# Patient Record
Sex: Male | Born: 1995 | Race: Black or African American | Hispanic: No | Smoking: Never smoker
Health system: Southern US, Community
[De-identification: ages and names within clinical notes are randomized; demographics above are authoritative.]

---
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
## 2013-03-19 ENCOUNTER — Encounter (HOSPITAL_COMMUNITY): Payer: Self-pay | Admitting: Emergency Medicine

## 2013-03-19 ENCOUNTER — Emergency Department (INDEPENDENT_AMBULATORY_CARE_PROVIDER_SITE_OTHER)
Admission: EM | Admit: 2013-03-19 | Discharge: 2013-03-19 | Disposition: A | Discharge status: Home / Self Care | Payer: Self-pay | Source: Home / Self Care | Attending: Emergency Medicine | Admitting: Emergency Medicine

## 2013-03-19 DIAGNOSIS — J069 Acute upper respiratory infection, unspecified: Secondary | ICD-10-CM

## 2013-03-19 MED ORDER — IPRATROPIUM BROMIDE 0.06 % NA SOLN: 2.0000 | Freq: Four times a day (QID) | NASAL | Status: AC

## 2013-03-19 NOTE — ED Notes (Signed)
Pt  Reports  Symptoms   Nasal  Congestion    /   Cough   /  Congested      sorethroat  With  The  Symptoms  Starting         sev days  Ago      Pt is  Sitting  Upright on exam table  Speaking  In  Complete  sentances  In no  Severe  Distress

## 2013-03-19 NOTE — ED Provider Notes (Signed)
Medical screening examination/treatment/procedure(s) were performed by non-physician practitioner and as supervising physician I was immediately available for consultation/collaboration.  Jessyca Sloan, M.D.  Vinny Taranto C Kylar Speelman, MD 03/19/13 2139 

## 2013-03-19 NOTE — ED Provider Notes (Signed)
CSN: [id number]     Arrival date & time 03/19/13  1029 History   First MD Initiated Contact with Patient 03/19/13 1330     Chief Complaint  Patient presents with  . URI   (Consider location/radiation/quality/duration/timing/severity/associated sxs/prior Treatment) Patient is a 18 y.o. male presenting with URI. The history is provided by the patient.  URI Presenting symptoms: congestion, cough, rhinorrhea and sore throat   Presenting symptoms: no fever   Severity:  Mild Onset quality:  Gradual Duration:  2 days Progression:  Waxing and waning Chronicity:  New Associated symptoms: headaches   Associated symptoms: no arthralgias, no myalgias, no neck pain, no sinus pain, no sneezing, no swollen glands and no wheezing     History reviewed. No pertinent past medical history. History reviewed. No pertinent past surgical history. History reviewed. No pertinent family history. History  Substance Use Topics  . Smoking status: Not on file  . Smokeless tobacco: Not on file  . Alcohol Use: Not on file    Review of Systems  Constitutional: Negative for fever.  HENT: Positive for congestion, rhinorrhea and sore throat. Negative for sneezing.   Eyes: Negative.   Respiratory: Positive for cough. Negative for wheezing.   Cardiovascular: Negative.   Gastrointestinal: Negative.   Musculoskeletal: Negative for arthralgias, myalgias and neck pain.  Skin: Negative.   Neurological: Positive for headaches.    Allergies  Review of patient's allergies indicates no known allergies.  Home Medications  No current outpatient prescriptions on file. BP 122/66  Pulse 64  Temp(Src) 98.5 F (36.9 C) (Oral)  Resp 14  SpO2 98% Physical Exam  Nursing note and vitals reviewed. Constitutional: He is oriented to person, place, and time. He appears well-developed and well-nourished. No distress.  HENT:  Head: Normocephalic and atraumatic.  Right Ear: Hearing, tympanic membrane, external ear and ear  canal normal.  Left Ear: Hearing, tympanic membrane, external ear and ear canal normal.  Nose: Nose normal.  Mouth/Throat: Uvula is midline, oropharynx is clear and moist and mucous membranes are normal.  Eyes: Conjunctivae are normal. Right eye exhibits no discharge. Left eye exhibits no discharge. No scleral icterus.  Neck: Normal range of motion. Neck supple.  Cardiovascular: Normal rate, regular rhythm and normal heart sounds.   Pulmonary/Chest: Effort normal and breath sounds normal.  Lymphadenopathy:    He has no cervical adenopathy.  Neurological: He is alert and oriented to person, place, and time.  Skin: Skin is warm and dry. No rash noted.  Psychiatric: He has a normal mood and affect. His behavior is normal.    ED Course  Procedures (including critical care time) Labs Review Labs Reviewed - No data to display Imaging Review No results found.  EKG Interpretation    Date/Time:    Ventricular Rate:    PR Interval:    QRS Duration:   QT Interval:    QTC Calculation:   R Axis:     Text Interpretation:              MDM  Exam consistent with mild URI. States he is most bothered by his runny nose and nasal congestion. Will provide Rx for atrovent nasal spray and patient counseled about additional symptomatic care at home.     Jess BartersJennifer Lee OconeePresson, [place] 03/19/13 1349

## 2019-01-31 ENCOUNTER — Other Ambulatory Visit: Payer: Self-pay

## 2019-01-31 ENCOUNTER — Encounter (HOSPITAL_COMMUNITY): Payer: Self-pay | Admitting: Emergency Medicine

## 2019-01-31 ENCOUNTER — Emergency Department (HOSPITAL_COMMUNITY)
Admission: EM | Admit: 2019-01-31 | Discharge: 2019-01-31 | Disposition: A | Discharge status: Home / Self Care | Payer: Self-pay | Attending: Emergency Medicine | Admitting: Emergency Medicine

## 2019-01-31 DIAGNOSIS — R0981 Nasal congestion: Secondary | ICD-10-CM | POA: Insufficient documentation

## 2019-01-31 DIAGNOSIS — J029 Acute pharyngitis, unspecified: Secondary | ICD-10-CM | POA: Insufficient documentation

## 2019-01-31 DIAGNOSIS — Z20828 Contact with and (suspected) exposure to other viral communicable diseases: Secondary | ICD-10-CM | POA: Insufficient documentation

## 2019-01-31 DIAGNOSIS — R0602 Shortness of breath: Secondary | ICD-10-CM | POA: Insufficient documentation

## 2019-01-31 LAB — GROUP A STREP BY PCR: Group A Strep by PCR: NOT DETECTED

## 2019-01-31 LAB — SARS CORONAVIRUS 2 (TAT 6-24 HRS): SARS Coronavirus 2: NEGATIVE

## 2019-01-31 NOTE — ED Provider Notes (Signed)
[facility] EMERGENCY DEPARTMENT Provider Note   CSN: [id number] Arrival date & time: 01/31/19  1611     History   Chief Complaint Chief Complaint  Patient presents with  . Sore Throat    HPI Hunter Harrison is a 23 y.o. male.     Patient is a 60 year old gentleman with no past medical history presenting to the emergency department for nasal congestion and sore throat which began yesterday. He reports that he is still eating and drinking normally and has not had a fever. Denies any shortness of breath but reports that it is hard to breathe out of his nostrils due to the amount of congestion. He has not taken any medication for relief. 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 Otherwise has no known exposure.     History reviewed. No pertinent past medical history.  There are no active problems to display for this patient.   History reviewed. No pertinent surgical history.      Home Medications    Prior to Admission medications   Not on File    Family History History reviewed. No pertinent family history.  Social History Social History   Tobacco Use  . Smoking status: Never Smoker  . Smokeless tobacco: Never Used  Substance Use Topics  . Alcohol use: Not Currently  . Drug use: Not Currently     Allergies   Patient has no known allergies.   Review of Systems Review of Systems  Constitutional: Negative for appetite change, chills, diaphoresis, fatigue and fever.  HENT: Positive for congestion, rhinorrhea and sore throat. Negative for ear pain, mouth sores, sinus pressure, sinus pain, sneezing, tinnitus, trouble swallowing and voice change.   Eyes: Negative for redness.  Respiratory: Positive for cough. Negative for apnea, choking, chest tightness, shortness of breath, wheezing and stridor.   Cardiovascular: Negative for chest pain.   Gastrointestinal: Negative for abdominal pain, diarrhea, nausea and vomiting.  Genitourinary: Negative for dysuria.  Musculoskeletal: Negative for back pain.  Skin: Negative for rash and wound.  Neurological: Negative for dizziness, light-headedness and headaches.     Physical Exam Updated Vital Signs BP 133/76 (BP Location: Right Arm)   Pulse 68   Temp 98.9 F (37.2 C) (Oral)   Resp 15   SpO2 100%   Physical Exam Vitals signs and nursing note reviewed.  Constitutional:      General: He is not in acute distress.    Appearance: Normal appearance. He is well-developed and normal weight. He is not ill-appearing, toxic-appearing or diaphoretic.  HENT:     Head: Normocephalic.     Mouth/Throat:     Mouth: Mucous membranes are moist. No oral lesions.     Pharynx: Posterior oropharyngeal erythema present. No pharyngeal swelling, oropharyngeal exudate or uvula swelling.     Tonsils: No tonsillar exudate or tonsillar abscesses. 1+ on the right. 1+ on the left.  Eyes:     Conjunctiva/sclera: Conjunctivae normal.     Pupils: Pupils are equal, round, and reactive to light.  Cardiovascular:     Rate and Rhythm: Normal rate and regular rhythm.     Pulses: Normal pulses.     Heart sounds: Normal heart sounds.  Pulmonary:     Effort: Pulmonary effort is normal.     Breath sounds: Normal breath sounds.  Abdominal:     Palpations: Abdomen is soft.  Skin:  General: Skin is dry.  Neurological:     Mental Status: He is alert.  Psychiatric:        Mood and Affect: Mood normal.      ED Treatments / Results  Labs (all labs ordered are listed, but only abnormal results are displayed) Labs Reviewed  GROUP A STREP BY PCR  SARS CORONAVIRUS 2 (TAT 6-24 HRS)    EKG None  Radiology No results found.  Procedures Procedures (including critical care time)  Medications Ordered in ED Medications - No data to display   Initial Impression / Assessment and Plan / ED Course  I have  reviewed the triage vital signs and the nursing notes.  Pertinent labs & imaging results that were available during my care of the patient were reviewed by me and considered in my medical decision making (see chart for details).  Clinical Course as of Jan 31 1635  Tue Jan 31, 2019  1632 Patient seen in the emergency department for nasal congestion and sore throat. Otherwise appears well, afebrile and normal oxygen saturations. Will be tested for Covid as well as strep throat. He has an exposure to a sick person but unknown if they are Covid positive or not. Advised on quarantine restrictions   [KM]    Clinical Course User Index [KM] Arlyn Dunning, PA-C       Based on review of vitals, medical screening exam, lab work and/or imaging, there does not appear to be an acute, emergent etiology for the patient's symptoms. Counseled pt on good return precautions and encouraged both PCP and ED follow-up as needed.  Prior to discharge, I also discussed incidental imaging findings with patient in detail and advised appropriate, recommended follow-up in detail.  Clinical Impression: 1. Sore throat     Disposition: Discharge  Prior to providing a prescription for a controlled substance, I independently reviewed the patient's recent prescription history on the [place] Controlled Substance Reporting System. The patient had no recent or regular prescriptions and was deemed appropriate for a brief, less than 3 day prescription of narcotic for acute analgesia.  This note was prepared with assistance of Conservation officer, historic buildings. Occasional wrong-word or sound-a-like substitutions may have occurred due to the inherent limitations of voice recognition software.   Final Clinical Impressions(s) / ED Diagnoses   Final diagnoses:  None    ED Discharge Orders    None       Jeral Pinch 02/02/19 2100    Benjiman Core, MD 02/02/19 [phone]

## 2019-01-31 NOTE — Discharge Instructions (Addendum)
You are seen in the emergency department today for sore throat and congestion. We have tested you for strep throat as well as COVID-19. Your strep throat test results was negative. Your Covid test results will come back in about 24 hours. You noted that you were exposed to someone in your vehicle that may have been sick but you are not sure if they were tested for Covid. If you have been exposed to known Covid you should quarantine yourself for 14 days from your last exposure.  The CDC counts exposure to covid as:  When you are within six feet of someone who is showing symptoms of COVID-19, for at least 15 minutes, or an infected person who shows no symptoms but later tests positive for the coronavirus. This is considered exposure regardless of whether one or both parties were wearing a mask.   The CDC recommends that if you meet the above criteria for exposure that you quarantine yourself for 14 days from the day you were exposed.  Even if you have a negative test today, YOU STILL NEED TO QUARANTINE FOR 14 DAYS if you know you have been exposed. This is because it is possible to be negative at first and then become positive even on the 14th day.    Quarantining means staying inside and away from family members and other people in public. If you are concerned about the safety of your family then please remove yourself from your home if possible or have family stay with other family members for the next 14 days. If you are very careful you can still quarantine yourself at home with your family but you must stay in a separate room, stay 6 feet away and be very careful to sanitize everything you come in contact with prior to your family members coming in contact with them.  If you develop fever/cough/breathlessness, please stay home for 10 days with improving symptoms and until you have had 24 hours of no fever (without taking a fever reducer).  Go to the nearest hospital ED for assessment if  fever/cough/breathlessness are severe or illness seems like a threat to life.  It is vitally important that if you feel that you have an infection such as this virus or any other virus that you stay home and away from places where you may spread it to others.  You should avoid contact with people age 75 and older.   You should wear a mask or cloth face covering over your nose and mouth if you must be around other people or animals, including pets (even at home). Try to stay at least 6 feet away from other people. This will protect the people around you.  Thank you for allowing me to care for you today.

## 2019-01-31 NOTE — ED Triage Notes (Signed)
Pt states he has a sore throat and some nasal congestion.

## 2019-02-03 ENCOUNTER — Telehealth: Payer: Self-pay

## 2019-02-03 NOTE — Telephone Encounter (Signed)
Pt. Called for COVID test results.  Pt. Stated that he went to the ER on 12/1 and confirmed that his name was spelled wrong when they took down his information.  Confirmed correct patient by name, dob, mother's name in EMR.  The pt. Confirmed that he received an After Visit Summary from the ER with his name spelled wrong; it was spelled "Diminique", instead of "Reese".   Advised that duplicate chart had been created, and will need to merge the charts.  Advised of negative COVID results; "not detected".  Verb. Understanding.  Pt. Requested to have COVID result faxed to his employer, [employer]; Attn: Michel Harrow.  Reported he will call back with the fax #.

## 2019-02-07 ENCOUNTER — Encounter (HOSPITAL_COMMUNITY): Payer: Self-pay | Admitting: Emergency Medicine

## 2021-09-12 ENCOUNTER — Encounter (HOSPITAL_COMMUNITY): Payer: Self-pay | Admitting: Emergency Medicine

## 2021-09-12 ENCOUNTER — Ambulatory Visit (HOSPITAL_COMMUNITY)
Admission: EM | Admit: 2021-09-12 | Discharge: 2021-09-12 | Disposition: A | Discharge status: Home / Self Care | Payer: Self-pay | Attending: Internal Medicine | Admitting: Internal Medicine

## 2021-09-12 DIAGNOSIS — L02214 Cutaneous abscess of groin: Secondary | ICD-10-CM

## 2021-09-12 MED ORDER — DOXYCYCLINE HYCLATE 100 MG PO CAPS: 100.0000 mg | ORAL_CAPSULE | Freq: Two times a day (BID) | ORAL | 0 refills | Status: AC

## 2021-09-12 NOTE — Discharge Instructions (Signed)
You have infection of the skin of the right groin which is being treated with antibiotics.  Please take this antibiotic with food as it may cause nausea if you do not.  Also recommend using sunscreen if you are going to be outside as this antibiotic can make you more prone to sunburn.  Please also use warm compresses which is a warm washcloth for about 15 to 20 minutes at a time 2-3 times daily.  Follow-up if symptoms persist or worsen.

## 2021-09-12 NOTE — ED Provider Notes (Signed)
[facility]    CSN: [id number] Arrival date & time: 09/12/21  1445      History   Chief Complaint Chief Complaint  Patient presents with   Abscess    HPI Hunter Harrison is a 26 y.o. male.   Patient presents with concerns for abscess to right groin that has been present for about 2 days.  Denies any drainage from the area.  Denies history of the same.  Denies fever, body aches, chills.   Abscess   History reviewed. No pertinent past medical history.  There are no problems to display for this patient.   History reviewed. No pertinent surgical history.     Home Medications    Prior to Admission medications   Medication Sig Start Date End Date Taking? Authorizing Provider  doxycycline (VIBRAMYCIN) 100 MG capsule Take 1 capsule (100 mg total) by mouth 2 (two) times daily. 09/12/21  Yes Laurabeth Yip, Rolly Salter E, FNP  ipratropium (ATROVENT) 0.06 % nasal spray Place 2 sprays into both nostrils 4 (four) times daily. 03/19/13   Presson, Mathis Fare, PA    Family History No family history on file.  Social History Social History   Tobacco Use   Smoking status: Never   Smokeless tobacco: Never  Substance Use Topics   Alcohol use: Not Currently   Drug use: Not Currently     Allergies   Patient has no known allergies.   Review of Systems Review of Systems Per HPI  Physical Exam Triage Vital Signs ED Triage Vitals  Enc Vitals Group     BP 09/12/21 1549 (!) 152/86     Pulse Rate 09/12/21 1549 77     Resp 09/12/21 1549 15     Temp 09/12/21 1549 98.6 F (37 C)     Temp Source 09/12/21 1549 Oral     SpO2 09/12/21 1549 100 %     Weight --      Height --      Head Circumference --      Peak Flow --      Pain Score 09/12/21 1547 4     Pain Loc --      Pain Edu? --      Excl. in GC? --    No data found.  Updated Vital Signs BP (!) 152/86 (BP Location: Left Arm)   Pulse 77   Temp 98.6 F (37 C) (Oral)   Resp 15   SpO2 100%   Visual  Acuity Right Eye Distance:   Left Eye Distance:   Bilateral Distance:    Right Eye Near:   Left Eye Near:    Bilateral Near:     Physical Exam Exam conducted with a chaperone present.  Constitutional:      General: He is not in acute distress.    Appearance: Normal appearance. He is not toxic-appearing or diaphoretic.  HENT:     Head: Normocephalic and atraumatic.  Eyes:     Extraocular Movements: Extraocular movements intact.     Conjunctiva/sclera: Conjunctivae normal.  Pulmonary:     Effort: Pulmonary effort is normal.  Skin:    Comments: Approximately 1 cm in diameter mildly raised, indurated flesh-colored abscess.  No drainage noted.  Neurological:     General: No focal deficit present.     Mental Status: He is alert and oriented to person, place, and time. Mental status is at baseline.  Psychiatric:        Mood and Affect: Mood normal.  Behavior: Behavior normal.        Thought Content: Thought content normal.        Judgment: Judgment normal.      UC Treatments / Results  Labs (all labs ordered are listed, but only abnormal results are displayed) Labs Reviewed - No data to display  EKG   Radiology No results found.  Procedures Procedures (including critical care time)  Medications Ordered in UC Medications - No data to display  Initial Impression / Assessment and Plan / UC Course  I have reviewed the triage vital signs and the nursing notes.  Pertinent labs & imaging results that were available during my care of the patient were reviewed by me and considered in my medical decision making (see chart for details).     Abscess versus folliculitis present to right groin area.  Will treat with doxycycline.  Advised of warm compresses as well.  No I&D indicated.  Patient advised to follow-up if symptoms persist or worsen.  Patient verbalized understanding and was agreeable with plan. Final Clinical Impressions(s) / UC Diagnoses   Final diagnoses:   Abscess of right groin     Discharge Instructions      You have infection of the skin of the right groin which is being treated with antibiotics.  Please take this antibiotic with food as it may cause nausea if you do not.  Also recommend using sunscreen if you are going to be outside as this antibiotic can make you more prone to sunburn.  Please also use warm compresses which is a warm washcloth for about 15 to 20 minutes at a time 2-3 times daily.  Follow-up if symptoms persist or worsen.    ED Prescriptions     Medication Sig Dispense Auth. Provider   doxycycline (VIBRAMYCIN) 100 MG capsule Take 1 capsule (100 mg total) by mouth 2 (two) times daily. 20 capsule Gustavus Bryant, [place]      PDMP not reviewed this encounter.   Gustavus Bryant, [place] 09/12/21 [phone]

## 2021-09-12 NOTE — ED Triage Notes (Signed)
Pt has swollen area to right groin area for 2 days. Reports painful at times or certain movements. Denies drainage
